# Patient Record
Sex: Female | Born: 1970 | Race: White | Hispanic: No | Marital: Married | Smoking: Former smoker
Health system: Southern US, Community
[De-identification: ages and names within clinical notes are randomized; demographics above are authoritative.]

## PROBLEM LIST (undated history)

## (undated) DIAGNOSIS — Z8249 Family history of ischemic heart disease and other diseases of the circulatory system: Secondary | ICD-10-CM

## (undated) DIAGNOSIS — E7800 Pure hypercholesterolemia, unspecified: Secondary | ICD-10-CM

## (undated) DIAGNOSIS — I10 Essential (primary) hypertension: Secondary | ICD-10-CM

## (undated) HISTORY — DX: Essential (primary) hypertension: I10

## (undated) HISTORY — DX: Family history of ischemic heart disease and other diseases of the circulatory system: Z82.49

## (undated) HISTORY — PX: BREAST ENHANCEMENT SURGERY: SHX7

## (undated) HISTORY — DX: Pure hypercholesterolemia, unspecified: E78.00

## (undated) HISTORY — DX: Hypercalcemia: E83.52

---

## 2018-01-22 LAB — HM MAMMOGRAPHY

## 2018-02-11 ENCOUNTER — Encounter: Payer: Self-pay | Admitting: Family Medicine

## 2018-02-11 ENCOUNTER — Ambulatory Visit: Payer: BLUE CROSS/BLUE SHIELD | Admitting: Family Medicine

## 2018-02-11 VITALS — BP 150/100 | HR 56 | Temp 98.4°F | Resp 16 | Ht 70.25 in | Wt 200.0 lb

## 2018-02-11 DIAGNOSIS — Z7689 Persons encountering health services in other specified circumstances: Secondary | ICD-10-CM

## 2018-02-11 DIAGNOSIS — E559 Vitamin D deficiency, unspecified: Secondary | ICD-10-CM

## 2018-02-11 DIAGNOSIS — I10 Essential (primary) hypertension: Secondary | ICD-10-CM | POA: Diagnosis not present

## 2018-02-11 DIAGNOSIS — R05 Cough: Secondary | ICD-10-CM | POA: Diagnosis not present

## 2018-02-11 DIAGNOSIS — R0781 Pleurodynia: Secondary | ICD-10-CM

## 2018-02-11 DIAGNOSIS — Z87891 Personal history of nicotine dependence: Secondary | ICD-10-CM | POA: Diagnosis not present

## 2018-02-11 DIAGNOSIS — E7800 Pure hypercholesterolemia, unspecified: Secondary | ICD-10-CM

## 2018-02-11 DIAGNOSIS — R058 Other specified cough: Secondary | ICD-10-CM

## 2018-02-11 DIAGNOSIS — Z8249 Family history of ischemic heart disease and other diseases of the circulatory system: Secondary | ICD-10-CM

## 2018-02-11 DIAGNOSIS — Z8349 Family history of other endocrine, nutritional and metabolic diseases: Secondary | ICD-10-CM

## 2018-02-11 DIAGNOSIS — E2839 Other primary ovarian failure: Secondary | ICD-10-CM

## 2018-02-11 LAB — POCT URINALYSIS DIP (PROADVANTAGE DEVICE)
Bilirubin, UA: NEGATIVE
Glucose, UA: NEGATIVE mg/dL
Ketones, POC UA: NEGATIVE mg/dL
LEUKOCYTES UA: NEGATIVE
Nitrite, UA: NEGATIVE
PH UA: 7 (ref 5.0–8.0)
Protein Ur, POC: NEGATIVE mg/dL
Specific Gravity, Urine: 1.015
Urobilinogen, Ur: NEGATIVE

## 2018-02-11 MED ORDER — LISINOPRIL 20 MG PO TABS: 20.0000 mg | ORAL_TABLET | Freq: Every day | ORAL | 2 refills | Status: DC

## 2018-02-11 MED ORDER — ATORVASTATIN CALCIUM 20 MG PO TABS: 20.0000 mg | ORAL_TABLET | Freq: Every day | ORAL | 2 refills | Status: DC

## 2018-02-11 NOTE — Progress Notes (Signed)
Subjective:    Patient ID: Paula Bryant, female    DOB: 04-Aug-1970, 48 y.o.   MRN: [id number]  HPI Chief Complaint  Patient presents with  . new pt    new pt, right side in back under rib cage. going on for 8 months. hurts when sleeping or taking deep breathe.    She is new to the practice and here for an acute complaint of right sided rib pain for the past 8 months. No known injury.  States pain feels like a soreness and sharp occasionally. States pain is worse when laying on her right side and with deep breaths. Pain has been more constant for the past month.  Denies fever, chills, dizziness, vision changes, headaches, chest pain, palpitations, shortness of breath, abdominal pain, N/V/D, urinary symptoms.   She also reports a chronic cough for the past several months and states it seemed to  improve after she stopped smoking in October. Smoked 1/2 ppd for 20 years.  She is occasionally coughing up brown mucus.    States she has a history of elevated blood pressures and states she has been told in the past that she should start on medication. States she does not want to take medication and does not like to go to doctors.  She has a BP cuff at home but does not check her BP.  Diet is fairly healthy.  Reports family history of HTN and MI in mother and maternal grandparents. Denies ever seeing a cardiologist.   Gluten intolerance. 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   Previous medical care: States she also lives in [place] but works here.  Married and has 2 kids.   Other providers: [facility]  [facility]ermatologist - [facility]   LMP: March 2018  Endometrial ablation  She reports going through menopause and having hot flashes and night sweats   Last pap: 2018   History of vitamin D deficiency and taking a once weekly supplement. Has not had this rechecked since March 2019.   She has lab results with her from March 2019. Her LDL at that time was  177. HDL 68.   Reviewed allergies, medications, past medical, surgical, family, and social history.    Review of Systems Pertinent positives and negatives in the history of present illness.     Objective:   Physical Exam BP (!) 150/100   Pulse (!) 56   Temp 98.4 F (36.9 C) (Oral)   Resp 16   Ht 5' 10.25" (1.784 m)   Wt 200 lb (90.7 kg) Comment: per pt (did not want to weigh)  LMP 04/06/2016   SpO2 98%   BMI 28.49 kg/m   Alert and oriented and in no acute distress.  Pharyngeal area is normal. Neck is supple without adenopathy or thyromegaly. Cardiac exam shows a regular sinus rhythm without murmurs or gallops. Lungs are clear to auscultation. Extremities without edema, pulses intact. Skin is warm and dry. PERRLA, EOMs intact. CNs intact. DTRs symmetric and normal. No clonus.   ECG sinus brady with rate 53. Suspicious for LVH. No acute ST changes.      Assessment & Plan:  Rib pain on right side - Plan: DG Chest 2 View, DG Thoracic Spine W/Swimmers  Cough present for greater than 3 weeks - Plan: DG Chest 2 View, DG Thoracic Spine W/Swimmers  Former smoker - Plan: DG Thoracic Spine W/Swimmers, Ambulatory referral to Cardiology  Vitamin D deficiency - Plan: VITAMIN D 25 Hydroxy (Vit-D  Deficiency, Fractures)  Family history of heart disease in female family member before age 82 - Plan: EKG 12-Lead, Ambulatory referral to Cardiology  Uncontrolled hypertension - Plan: CBC with Differential/Platelet, Comprehensive metabolic panel, POCT Urinalysis DIP (Proadvantage Device), Lipid panel, EKG 12-Lead, Ambulatory referral to Cardiology, lisinopril (PRINIVIL,ZESTRIL) 20 MG tablet  Family history of thyroid disease in mother - Plan: TSH, T4, free  Elevated LDL cholesterol level - Plan: Lipid panel, Ambulatory referral to Cardiology, atorvastatin (LIPITOR) 20 MG tablet  Encounter to establish care  Estrogen deficiency - Plan: DG Bone Density  Essential hypertension  She is a  pleasant 48 year old female who is new to the practice today and here for an acute complaint of right posterior rib pain for the past 8 months.   While here she was found to have an elevated blood pressure and after further discussion she has had uncontrolled hypertension for approximately 2 years.  She has declined medication in the past and avoided medical care.  X-ray ordered to evaluate area of concern for rib pain and for persistent cough.   Discussed multiple risk factors for heart disease including uncontrolled hypertension, history of elevated LDL, former smoker, and a significant family history. Referral to cardiology for further evaluation. She agrees to start on blood pressure medication but only agrees to starting on 1 medication at a low dose. I also convinced her to start on a statin. Counseling done on healthy lifestyle including diet and exercise to help control hypertension and hyperlipidemia. Encouraged her to hold off on any intense exercise until cleared by cardiology. She brought in lab results from March 2019.  States she had these done at a walk-in lab and that no medical professional has reviewed them.  Her LDL is 177.  History of 4 metatarsal fractures per patient without any known cause.  She is menopausal and estrogen deficient.  Denies ever having a bone density study.  Bone density test ordered. History of vitamin D deficiency.  She is taking a vitamin D supplement.  We will recheck vitamin D level. Check labs and have her follow-up in 2 weeks. Spent at least 45 minutes face-to-face with patient and more than 50% was in counseling and coordination of care.

## 2018-02-11 NOTE — Patient Instructions (Addendum)
You can go to [facility] for your chest and rib X rays.  [facility] and [facility]   Your blood pressure today is 150/100. Goal BP is less than 130/80.   I am referring you to a cardiologist due to multiple risk factors for heart disease and I recommend that you start on medication for your BP as well as for your cholesterol.   Buy a BP cuff and start checking your BP on a regular basis.     DASH Eating Plan DASH stands for "Dietary Approaches to Stop Hypertension." The DASH eating plan is a healthy eating plan that has been shown to reduce high blood pressure (hypertension). It may also reduce your risk for type 2 diabetes, heart disease, and stroke. The DASH eating plan may also help with weight loss. What are tips for following this plan?  General guidelines  Avoid eating more than 2,300 mg (milligrams) of salt (sodium) a day. If you have hypertension, you may need to reduce your sodium intake to 1,500 mg a day.  Limit alcohol intake to no more than 1 drink a day for nonpregnant women and 2 drinks a day for men. One drink equals 12 oz of beer, 5 oz of wine, or 1 oz of hard liquor.  Work with your health care provider to maintain a healthy body weight or to lose weight. Ask what an ideal weight is for you.  Get at least 30 minutes of exercise that causes your heart to beat faster (aerobic exercise) most days of the week. Activities may include walking, swimming, or biking.  Work with your health care provider or diet and nutrition specialist (dietitian) to adjust your eating plan to your individual calorie needs. Reading food labels   Check food labels for the amount of sodium per serving. Choose foods with less than 5 percent of the Daily Value of sodium. Generally, foods with less than 300 mg of sodium per serving fit into this eating plan.  To find whole grains, look for the word "whole" as the first word in the ingredient  list. Shopping  Buy products labeled as "low-sodium" or "no salt added."  Buy fresh foods. Avoid canned foods and premade or frozen meals. Cooking  Avoid adding salt when cooking. Use salt-free seasonings or herbs instead of table salt or sea salt. Check with your health care provider or pharmacist before using salt substitutes.  Do not fry foods. Cook foods using healthy methods such as baking, boiling, grilling, and broiling instead.  Cook with heart-healthy oils, such as olive, canola, soybean, or sunflower oil. Meal planning  Eat a balanced diet that includes: ? 5 or more servings of fruits and vegetables each day. At each meal, try to fill half of your plate with fruits and vegetables. ? Up to 6-8 servings of whole grains each day. ? Less than 6 oz of lean meat, poultry, or fish each day. A 3-oz serving of meat is about the same size as a deck of cards. One egg equals 1 oz. ? 2 servings of low-fat dairy each day. ? A serving of nuts, seeds, or beans 5 times each week. ? Heart-healthy fats. Healthy fats called Omega-3 fatty acids are found in foods such as flaxseeds and coldwater fish, like sardines, salmon, and mackerel.  Limit how much you eat of the following: ? Canned or prepackaged foods. ? Food that is high in trans fat, such as fried foods. ? Food that  is high in saturated fat, such as fatty meat. ? Sweets, desserts, sugary drinks, and other foods with added sugar. ? Full-fat dairy products.  Do not salt foods before eating.  Try to eat at least 2 vegetarian meals each week.  Eat more home-cooked food and less restaurant, buffet, and fast food.  When eating at a restaurant, ask that your food be prepared with less salt or no salt, if possible. What foods are recommended? The items listed may not be a complete list. Talk with your dietitian about what dietary choices are best for you. Grains Whole-grain or whole-wheat bread. Whole-grain or whole-wheat pasta. Brown  rice. Oatmeal. Quinoa. Bulgur. Whole-grain and low-sodium cereals. Pita bread. Low-fat, low-sodium crackers. Whole-wheat flour tortillas. Vegetables Fresh or frozen vegetables (raw, steamed, roasted, or grilled). Low-sodium or reduced-sodium tomato and vegetable juice. Low-sodium or reduced-sodium tomato sauce and tomato paste. Low-sodium or reduced-sodium canned vegetables. Fruits All fresh, dried, or frozen fruit. Canned fruit in natural juice (without added sugar). Meat and other protein foods Skinless chicken or turkey. Ground chicken or turkey. Pork with fat trimmed off. Fish and seafood. Egg whites. Dried beans, peas, or lentils. Unsalted nuts, nut butters, and seeds. Unsalted canned beans. Lean cuts of beef with fat trimmed off. Low-sodium, lean deli meat. Dairy Low-fat (1%) or fat-free (skim) milk. Fat-free, low-fat, or reduced-fat cheeses. Nonfat, low-sodium ricotta or cottage cheese. Low-fat or nonfat yogurt. Low-fat, low-sodium cheese. Fats and oils Soft margarine without trans fats. Vegetable oil. Low-fat, reduced-fat, or light mayonnaise and salad dressings (reduced-sodium). Canola, safflower, olive, soybean, and sunflower oils. Avocado. Seasoning and other foods Herbs. Spices. Seasoning mixes without salt. Unsalted popcorn and pretzels. Fat-free sweets. What foods are not recommended? The items listed may not be a complete list. Talk with your dietitian about what dietary choices are best for you. Grains Baked goods made with fat, such as croissants, muffins, or some breads. Dry pasta or rice meal packs. Vegetables Creamed or fried vegetables. Vegetables in a cheese sauce. Regular canned vegetables (not low-sodium or reduced-sodium). Regular canned tomato sauce and paste (not low-sodium or reduced-sodium). Regular tomato and vegetable juice (not low-sodium or reduced-sodium). Pickles. Olives. Fruits Canned fruit in a light or heavy syrup. Fried fruit. Fruit in cream or butter  sauce. Meat and other protein foods Fatty cuts of meat. Ribs. Fried meat. Bacon. Sausage. Bologna and other processed lunch meats. Salami. Fatback. Hotdogs. Bratwurst. Salted nuts and seeds. Canned beans with added salt. Canned or smoked fish. Whole eggs or egg yolks. Chicken or turkey with skin. Dairy Whole or 2% milk, cream, and half-and-half. Whole or full-fat cream cheese. Whole-fat or sweetened yogurt. Full-fat cheese. Nondairy creamers. Whipped toppings. Processed cheese and cheese spreads. Fats and oils Butter. Stick margarine. Lard. Shortening. Ghee. Bacon fat. Tropical oils, such as coconut, palm kernel, or palm oil. Seasoning and other foods Salted popcorn and pretzels. Onion salt, garlic salt, seasoned salt, table salt, and sea salt. Worcestershire sauce. Tartar sauce. Barbecue sauce. Teriyaki sauce. Soy sauce, including reduced-sodium. Steak sauce. Canned and packaged gravies. Fish sauce. Oyster sauce. Cocktail sauce. Horseradish that you find on the shelf. Ketchup. Mustard. Meat flavorings and tenderizers. Bouillon cubes. Hot sauce and Tabasco sauce. Premade or packaged marinades. Premade or packaged taco seasonings. Relishes. Regular salad dressings. Where to find more information:  National Heart, Lung, and Blood Institute: www.nhlbi.nih.gov  American Heart Association: www.heart.org Summary  The DASH eating plan is a healthy eating plan that has been shown to reduce high blood pressure (hypertension).   It may also reduce your risk for type 2 diabetes, heart disease, and stroke.  With the DASH eating plan, you should limit salt (sodium) intake to 2,300 mg a day. If you have hypertension, you may need to reduce your sodium intake to 1,500 mg a day.  When on the DASH eating plan, aim to eat more fresh fruits and vegetables, whole grains, lean proteins, low-fat dairy, and heart-healthy fats.  Work with your health care provider or diet and nutrition specialist (dietitian) to adjust  your eating plan to your individual calorie needs. This information is not intended to replace advice given to you by your health care provider. Make sure you discuss any questions you have with your health care provider. Document Released: 01/12/2011 Document Revised: 01/17/2016 Document Reviewed: 01/17/2016 Elsevier Interactive Patient Education  2019 [employer]   Hypertension Hypertension, commonly called high blood pressure, is when the force of blood pumping through the arteries is too strong. The arteries are the blood vessels that carry blood from the heart throughout the body. Hypertension forces the heart to work harder to pump blood and may cause arteries to become narrow or stiff. Having untreated or uncontrolled hypertension can cause heart attacks, strokes, kidney disease, and other problems. A blood pressure reading consists of a higher number over a lower number. Ideally, your blood pressure should be below 120/80. The first ("top") number is called the systolic pressure. It is a measure of the pressure in your arteries as your heart beats. The second ("bottom") number is called the diastolic pressure. It is a measure of the pressure in your arteries as the heart relaxes. What are the causes? The cause of this condition is not known. What increases the risk? Some risk factors for high blood pressure are under your control. Others are not. Factors you can change  Smoking.  Having type 2 diabetes mellitus, high cholesterol, or both.  Not getting enough exercise or physical activity.  Being overweight.  Having too much fat, sugar, calories, or salt (sodium) in your diet.  Drinking too much alcohol. Factors that are difficult or impossible to change  Having chronic kidney disease.  Having a family history of high blood pressure.  Age. Risk increases with age.  Race. You may be at higher risk if you are African-American.  Gender. Men are at higher risk than women  before age 48. After age 48, women are at higher risk than men.  Having obstructive sleep apnea.  Stress. What are the signs or symptoms? Extremely high blood pressure (hypertensive crisis) may cause:  Headache.  Anxiety.  Shortness of breath.  Nosebleed.  Nausea and vomiting.  Severe chest pain.  Jerky movements you cannot control (seizures). How is this diagnosed? This condition is diagnosed by measuring your blood pressure while you are seated, with your arm resting on a surface. The cuff of the blood pressure monitor will be placed directly against the skin of your upper arm at the level of your heart. It should be measured at least twice using the same arm. Certain conditions can cause a difference in blood pressure between your right and left arms. Certain factors can cause blood pressure readings to be lower or higher than normal (elevated) for a short period of time:  When your blood pressure is higher when you are in a health care provider's office than when you are at home, this is called white coat hypertension. Most people with this condition do not need medicines.  When your blood  pressure is higher at home than when you are in a health care provider's office, this is called masked hypertension. Most people with this condition may need medicines to control blood pressure. If you have a high blood pressure reading during one visit or you have normal blood pressure with other risk factors:  You may be asked to return on a different day to have your blood pressure checked again.  You may be asked to monitor your blood pressure at home for 1 week or longer. If you are diagnosed with hypertension, you may have other blood or imaging tests to help your health care provider understand your overall risk for other conditions. How is this treated? This condition is treated by making healthy lifestyle changes, such as eating healthy foods, exercising more, and reducing your  alcohol intake. Your health care provider may prescribe medicine if lifestyle changes are not enough to get your blood pressure under control, and if:  Your systolic blood pressure is above 130.  Your diastolic blood pressure is above 80. Your personal target blood pressure may vary depending on your medical conditions, your age, and other factors. Follow these instructions at home: Eating and drinking   Eat a diet that is high in fiber and potassium, and low in sodium, added sugar, and fat. An example eating plan is called the DASH (Dietary Approaches to Stop Hypertension) diet. To eat this way: ? Eat plenty of fresh fruits and vegetables. Try to fill half of your plate at each meal with fruits and vegetables. ? Eat whole grains, such as whole wheat pasta, brown rice, or whole grain bread. Fill about one quarter of your plate with whole grains. ? Eat or drink low-fat dairy products, such as skim milk or low-fat yogurt. ? Avoid fatty cuts of meat, processed or cured meats, and poultry with skin. Fill about one quarter of your plate with lean proteins, such as fish, chicken without skin, beans, eggs, and tofu. ? Avoid premade and processed foods. These tend to be higher in sodium, added sugar, and fat.  Reduce your daily sodium intake. Most people with hypertension should eat less than 1,500 mg of sodium a day.  Limit alcohol intake to no more than 1 drink a day for nonpregnant women and 2 drinks a day for men. One drink equals 12 oz of beer, 5 oz of wine, or 1 oz of hard liquor. Lifestyle   Work with your health care provider to maintain a healthy body weight or to lose weight. Ask what an ideal weight is for you.  Get at least 30 minutes of exercise that causes your heart to beat faster (aerobic exercise) most days of the week. Activities may include walking, swimming, or biking.  Include exercise to strengthen your muscles (resistance exercise), such as pilates or lifting weights, as  part of your weekly exercise routine. Try to do these types of exercises for 30 minutes at least 3 days a week.  Do not use any products that contain nicotine or tobacco, such as cigarettes and e-cigarettes. If you need help quitting, ask your health care provider.  Monitor your blood pressure at home as told by your health care provider.  Keep all follow-up visits as told by your health care provider. This is important. Medicines  Take over-the-counter and prescription medicines only as told by your health care provider. Follow directions carefully. Blood pressure medicines must be taken as prescribed.  Do not skip doses of blood pressure medicine. Doing this  puts you at risk for problems and can make the medicine less effective.  Ask your health care provider about side effects or reactions to medicines that you should watch for. Contact a health care provider if:  You think you are having a reaction to a medicine you are taking.  You have headaches that keep coming back (recurring).  You feel dizzy.  You have swelling in your ankles.  You have trouble with your vision. Get help right away if:  You develop a severe headache or confusion.  You have unusual weakness or numbness.  You feel faint.  You have severe pain in your chest or abdomen.  You vomit repeatedly.  You have trouble breathing. Summary  Hypertension is when the force of blood pumping through your arteries is too strong. If this condition is not controlled, it may put you at risk for serious complications.  Your personal target blood pressure may vary depending on your medical conditions, your age, and other factors. For most people, a normal blood pressure is less than 120/80.  Hypertension is treated with lifestyle changes, medicines, or a combination of both. Lifestyle changes include weight loss, eating a healthy, low-sodium diet, exercising more, and limiting alcohol. This information is not intended  to replace advice given to you by your health care provider. Make sure you discuss any questions you have with your health care provider. Document Released: 01/23/2005 Document Revised: 12/22/2015 Document Reviewed: 12/22/2015 Elsevier Interactive Patient Education  2019 [employer].

## 2018-02-12 ENCOUNTER — Ambulatory Visit
Admission: RE | Admit: 2018-02-12 | Discharge: 2018-02-12 | Disposition: A | Discharge status: Home / Self Care | Payer: BLUE CROSS/BLUE SHIELD | Source: Ambulatory Visit | Attending: Family Medicine | Admitting: Family Medicine

## 2018-02-12 DIAGNOSIS — E2839 Other primary ovarian failure: Secondary | ICD-10-CM

## 2018-02-12 DIAGNOSIS — Z87891 Personal history of nicotine dependence: Secondary | ICD-10-CM

## 2018-02-12 DIAGNOSIS — R05 Cough: Secondary | ICD-10-CM

## 2018-02-12 DIAGNOSIS — R0781 Pleurodynia: Secondary | ICD-10-CM

## 2018-02-12 DIAGNOSIS — R058 Other specified cough: Secondary | ICD-10-CM

## 2018-02-12 LAB — VITAMIN D 25 HYDROXY (VIT D DEFICIENCY, FRACTURES): Vit D, 25-Hydroxy: 39.5 ng/mL (ref 30.0–100.0)

## 2018-02-12 LAB — COMPREHENSIVE METABOLIC PANEL
ALT: 21 IU/L (ref 0–32)
AST: 32 IU/L (ref 0–40)
Albumin/Globulin Ratio: 1.7 (ref 1.2–2.2)
Albumin: 4.8 g/dL (ref 3.5–5.5)
Alkaline Phosphatase: 82 IU/L (ref 39–117)
BUN/Creatinine Ratio: 20 (ref 9–23)
BUN: 15 mg/dL (ref 6–24)
Bilirubin Total: 0.7 mg/dL (ref 0.0–1.2)
CO2: 24 mmol/L (ref 20–29)
Calcium: 11.2 mg/dL — ABNORMAL HIGH (ref 8.7–10.2)
Chloride: 94 mmol/L — ABNORMAL LOW (ref 96–106)
Creatinine, Ser: 0.75 mg/dL (ref 0.57–1.00)
GFR calc Af Amer: 110 mL/min/{1.73_m2} (ref >59)
GFR calc non Af Amer: 95 mL/min/{1.73_m2} (ref >59)
Globulin, Total: 2.9 g/dL (ref 1.5–4.5)
Glucose: 92 mg/dL (ref 65–99)
Potassium: 3.9 mmol/L (ref 3.5–5.2)
Sodium: 134 mmol/L (ref 134–144)
Total Protein: 7.7 g/dL (ref 6.0–8.5)

## 2018-02-12 LAB — LIPID PANEL
Chol/HDL Ratio: 3.2 ratio (ref 0.0–4.4)
Cholesterol, Total: 325 mg/dL — ABNORMAL HIGH (ref 100–199)
HDL: 103 mg/dL (ref >39)
LDL Calculated: 203 mg/dL — ABNORMAL HIGH (ref 0–99)
Triglycerides: 95 mg/dL (ref 0–149)
VLDL Cholesterol Cal: 19 mg/dL (ref 5–40)

## 2018-02-12 LAB — CBC WITH DIFFERENTIAL/PLATELET
BASOS ABS: 0.1 10*3/uL (ref 0.0–0.2)
Basos: 1 %
EOS (ABSOLUTE): 0.1 10*3/uL (ref 0.0–0.4)
Eos: 2 %
Hematocrit: 42.2 % (ref 34.0–46.6)
Hemoglobin: 14.6 g/dL (ref 11.1–15.9)
Immature Grans (Abs): 0 10*3/uL (ref 0.0–0.1)
Immature Granulocytes: 0 %
Lymphocytes Absolute: 1.5 10*3/uL (ref 0.7–3.1)
Lymphs: 29 %
MCH: 32.7 pg (ref 26.6–33.0)
MCHC: 34.6 g/dL (ref 31.5–35.7)
MCV: 95 fL (ref 79–97)
Monocytes Absolute: 0.6 10*3/uL (ref 0.1–0.9)
Monocytes: 12 %
Neutrophils Absolute: 3 10*3/uL (ref 1.4–7.0)
Neutrophils: 56 %
Platelets: 288 10*3/uL (ref 150–450)
RBC: 4.46 x10E6/uL (ref 3.77–5.28)
RDW: 13 % (ref 11.7–15.4)
WBC: 5.3 10*3/uL (ref 3.4–10.8)

## 2018-02-12 LAB — T4, FREE: Free T4: 1.19 ng/dL (ref 0.82–1.77)

## 2018-02-12 LAB — TSH: TSH: 4.09 u[IU]/mL (ref 0.450–4.500)

## 2018-02-13 ENCOUNTER — Ambulatory Visit
Admission: RE | Admit: 2018-02-13 | Discharge: 2018-02-13 | Disposition: A | Discharge status: Home / Self Care | Payer: BLUE CROSS/BLUE SHIELD | Source: Ambulatory Visit | Attending: Family Medicine | Admitting: Family Medicine

## 2018-02-13 DIAGNOSIS — R0781 Pleurodynia: Secondary | ICD-10-CM

## 2018-02-13 DIAGNOSIS — R058 Other specified cough: Secondary | ICD-10-CM

## 2018-02-13 DIAGNOSIS — R05 Cough: Secondary | ICD-10-CM

## 2018-02-14 ENCOUNTER — Encounter: Payer: Self-pay | Admitting: Family Medicine

## 2018-02-14 HISTORY — DX: Hypercalcemia: E83.52

## 2018-02-15 ENCOUNTER — Institutional Professional Consult (permissible substitution): Payer: BLUE CROSS/BLUE SHIELD | Admitting: Family Medicine

## 2018-02-25 ENCOUNTER — Ambulatory Visit (INDEPENDENT_AMBULATORY_CARE_PROVIDER_SITE_OTHER): Payer: BLUE CROSS/BLUE SHIELD | Admitting: Family Medicine

## 2018-02-25 ENCOUNTER — Encounter: Payer: Self-pay | Admitting: Family Medicine

## 2018-02-25 VITALS — BP 132/82 | HR 53 | Wt 199.0 lb

## 2018-02-25 DIAGNOSIS — I10 Essential (primary) hypertension: Secondary | ICD-10-CM

## 2018-02-25 DIAGNOSIS — M8580 Other specified disorders of bone density and structure, unspecified site: Secondary | ICD-10-CM | POA: Diagnosis not present

## 2018-02-25 DIAGNOSIS — E7800 Pure hypercholesterolemia, unspecified: Secondary | ICD-10-CM

## 2018-02-25 NOTE — Progress Notes (Signed)
   Subjective:    Patient ID: Paula Bryant, female    DOB: 07/27/1970, 48 y.o.   MRN: [id number]  HPI Chief Complaint  Patient presents with  . follow-up    follow-up on bp and pain in back- doing alittle better. bp- lowest- 112/73 and highest- 161/94   She is here to follow up on HTN, abnormal labs, and abnormal DEXA.  Reports taking lisinopril daily without side effects.   States she has not yet heard from the cardiologist regarding get established appointment.  Reports having intermittent palpitations a couple of times a week.  Reports she notices this more often when she is laying down and not with exertion.  No chest pain or shortness of breath.    Her LDL was significantly elevated at 203 and we started her on a statin.  She reports taking this daily and no side effects.  Reports eating a healthy diet but is low in cholesterol and fat.  She does have a family history of hyperlipidemia.  Requests a repeat fasting lipid panel today.  Denies fever, chills, dizziness, chest pain,  shortness of breath, cough, abdominal pain, N/V/D, LE edema.   Her calcium was elevated at her previous visit.  She reports she was taking a significant amount of calcium including a women's One a Day, Viactiv, protein shake, and drinking almond milk.   DEXA showed osteopenia.  Her vitamin D level was normal.   [facility] -reports to be up-to-date on mammogram and Pap smear.  I have no record of these and I will request them  Reviewed allergies, medications, past medical, surgical, family, and social history.    Review of Systems Pertinent positives and negatives in the history of present illness.     Objective:   Physical Exam BP 132/82   Pulse (!) 53   Wt 199 lb (90.3 kg) Comment: per pt-  BMI 28.35 kg/m   Alert and oriented and in no acute distress.  Not otherwise examined.      Assessment & Plan:  Essential hypertension  Hypercalcemia - Plan: Comprehensive metabolic panel,  PTH, Intact and Calcium  Osteopenia determined by x-ray  Elevated LDL cholesterol level - Plan: Lipid panel  This is her second visit with me.  I reviewed in person her lab results, x-ray results and DEXA. She appears to be doing well on the lisinopril and her blood pressure is responding nicely.  We will continue on this for now.  Diet seems to be somewhat healthy. Reports good compliance with statin and no side effects. Her calcium was significantly elevated at her previous visit.  We will recheck this today and also check PTH. Counseling on osteopenia. She requests a repeat fasting lipid panel today.  Advised her that insurance may not cover this since her last one was 02/11/2018 and that we will most likely not see a significant improvement at this point.  she would still like to have it checked. Follow-up pending labs.  She will see cardiology on 03/15/2018.  Encouraged her to avoid starting a vigorous exercise routine before being evaluated by them.

## 2018-02-26 LAB — COMPREHENSIVE METABOLIC PANEL
ALT: 22 IU/L (ref 0–32)
AST: 27 IU/L (ref 0–40)
Albumin/Globulin Ratio: 2 (ref 1.2–2.2)
Albumin: 4.7 g/dL (ref 3.8–4.8)
Alkaline Phosphatase: 76 IU/L (ref 39–117)
BUN/Creatinine Ratio: 15 (ref 9–23)
BUN: 11 mg/dL (ref 6–24)
Bilirubin Total: 0.8 mg/dL (ref 0.0–1.2)
CO2: 23 mmol/L (ref 20–29)
Calcium: 9.8 mg/dL (ref 8.7–10.2)
Chloride: 96 mmol/L (ref 96–106)
Creatinine, Ser: 0.71 mg/dL (ref 0.57–1.00)
GFR calc Af Amer: 117 mL/min/{1.73_m2} (ref >59)
GFR calc non Af Amer: 102 mL/min/{1.73_m2} (ref >59)
Globulin, Total: 2.4 g/dL (ref 1.5–4.5)
Glucose: 76 mg/dL (ref 65–99)
Potassium: 4.5 mmol/L (ref 3.5–5.2)
Sodium: 137 mmol/L (ref 134–144)
Total Protein: 7.1 g/dL (ref 6.0–8.5)

## 2018-02-26 LAB — LIPID PANEL
Chol/HDL Ratio: 3 ratio (ref 0.0–4.4)
Cholesterol, Total: 207 mg/dL — ABNORMAL HIGH (ref 100–199)
HDL: 70 mg/dL (ref >39)
LDL Calculated: 122 mg/dL — ABNORMAL HIGH (ref 0–99)
Triglycerides: 73 mg/dL (ref 0–149)
VLDL Cholesterol Cal: 15 mg/dL (ref 5–40)

## 2018-02-26 LAB — PTH, INTACT AND CALCIUM: PTH: 25 pg/mL (ref 15–65)

## 2018-03-06 ENCOUNTER — Telehealth: Payer: Self-pay | Admitting: Family Medicine

## 2018-03-06 NOTE — Telephone Encounter (Signed)
Records received from [facility]. Sending back for review.

## 2018-03-22 ENCOUNTER — Encounter: Payer: Self-pay | Admitting: Internal Medicine

## 2018-03-24 ENCOUNTER — Encounter: Payer: Self-pay | Admitting: Cardiology

## 2018-03-24 DIAGNOSIS — Z8249 Family history of ischemic heart disease and other diseases of the circulatory system: Secondary | ICD-10-CM

## 2018-03-24 HISTORY — DX: Family history of ischemic heart disease and other diseases of the circulatory system: Z82.49

## 2018-03-24 NOTE — Progress Notes (Signed)
Cardiology Office Note    Date:  03/25/2018   ID:  Jameice Sarsour, DOB Dec 29, 1970, MRN [id number]  PCP:  Avanell Shackleton, NP-C  Cardiologist:  Armanda Magic, MD   Chief Complaint  Patient presents with  . Hyperlipidemia    History of Present Illness:  Paula Bryant is a 48 y.o. female who is being seen today for the evaluation of Hyperlipidemia  at the request of Suezanne Jacquet, Vickie L, NP-C.  This is a 48yo female with a hx of hyperlipidemia, HTN and hypercalcemia who is referred by PCP for evaluation of hyperlipidemia.  She has a hx of tobacco use and has a strong fm hx of premature CAD with both parents having MIs in their 45's. She tells me that she has been having chest aching like a tooth ache recently with no associated SOB, nausea, diaphoresis.  This occurs a few times weekly and is nonexertional.  She denies any SOB, DOE, PND, orthopnea, LE edema, dizziness or syncope. She is compliant with her meds and is tolerating meds with no SE.    Past Medical History:  Diagnosis Date  . Elevated LDL cholesterol level   . Family history of premature coronary artery disease 03/24/2018  . HTN (hypertension)   . Hypercalcemia 02/14/2018    Past Surgical History:  Procedure Laterality Date  . BREAST ENHANCEMENT SURGERY      Current Medications: Current Meds  Medication Sig  . APPLE CIDER VINEGAR PO Take by mouth.  . Ascorbic Acid (VITAMIN C PO) Take by mouth.  Marland Kitchen BIOTIN PO Take by mouth.  . COD LIVER OIL PO Take by mouth.  . Doxylamine Succinate, Sleep, (SLEEP AID PO) Take by mouth.  . Ginkgo Biloba (GNP GINGKO BILOBA EXTRACT PO) Take by mouth.  Marland Kitchen lisinopril (PRINIVIL,ZESTRIL) 20 MG tablet Take 1 tablet (20 mg total) by mouth daily.  . Multiple Vitamin (MULTIVITAMIN) tablet Take 1 tablet by mouth daily.  . NON FORMULARY Amberen  . Nutritional Supplements (ESTROVEN PO) Take by mouth.    Allergies:   Patient has no known allergies.   Social History   Socioeconomic History  .  Marital status: Married    Spouse name: Not on file  . Number of children: Not on file  . Years of education: Not on file  . Highest education level: Not on file  Occupational History  . Not on file  Social Needs  . Financial resource strain: Not on file  . Food insecurity:    Worry: Not on file    Inability: Not on file  . Transportation needs:    Medical: Not on file    Non-medical: Not on file  Tobacco Use  . Smoking status: Former Smoker    Packs/day: 0.50    Years: 20.00    Pack years: 10.00    Types: Cigarettes    Last attempt to quit: 11/08/2017    Years since quitting: 0.3  . Smokeless tobacco: Never Used  Substance and Sexual Activity  . Alcohol use: Yes    Comment: 4-5 glasses of wine  . Drug use: Never  . Sexual activity: Not on file  Lifestyle  . Physical activity:    Days per week: Not on file    Minutes per session: Not on file  . Stress: Not on file  Relationships  . Social connections:    Talks on phone: Not on file    Gets together: Not on file    Attends religious service: Not on file  Active member of club or organization: Not on file    Attends meetings of clubs or organizations: Not on file    Relationship status: Not on file  Other Topics Concern  . Not on file  Social History Narrative  . Not on file     Family History:  The patient's family history includes Crohn's disease in her brother; Heart attack in her father; Heart attack (age of onset: 34) in her mother; Heart attack (age of onset: 61) in her maternal grandmother; Heart attack (age of onset: 55) in her maternal grandfather; Hyperlipidemia in her mother; Hypertension in her father and mother; Thyroid disease in her mother.   ROS:   Please see the history of present illness.    ROS All other systems reviewed and are negative.  No flowsheet data found.     PHYSICAL EXAM:   VS:  BP (!) 144/89   Pulse 64   Ht 5' 10.25" (1.784 m)   Wt 198 lb (89.8 kg)   BMI 28.21 kg/m      GEN: Well nourished, well developed, in no acute distress  HEENT: normal  Neck: no JVD, carotid bruits, or masses Cardiac: RRR; no murmurs, rubs, or gallops,no edema.  Intact distal pulses bilaterally.  Respiratory:  clear to auscultation bilaterally, normal work of breathing GI: soft, nontender, nondistended, + BS MS: no deformity or atrophy  Skin: warm and dry, no rash Neuro:  Alert and Oriented x 3, Strength and sensation are intact Psych: euthymic mood, full affect  Wt Readings from Last 3 Encounters:  03/25/18 198 lb (89.8 kg)  02/25/18 199 lb (90.3 kg)  02/11/18 200 lb (90.7 kg)      Studies/Labs Reviewed:   EKG:  EKG is not ordered today.    Recent Labs: 02/11/2018: Hemoglobin 14.6; Platelets 288; TSH 4.090 02/25/2018: ALT 22; BUN 11; Creatinine, Ser 0.71; Potassium 4.5; Sodium 137   Lipid Panel    Component Value Date/Time   CHOL 207 (H) 02/25/2018 1145   TRIG 73 02/25/2018 1145   HDL 70 02/25/2018 1145   CHOLHDL 3.0 02/25/2018 1145   LDLCALC 122 (H) 02/25/2018 1145    Additional studies/ records that were reviewed today include:  Office notes from PCP, lipids    ASSESSMENT:    1. Elevated LDL cholesterol level   2. Uncontrolled hypertension   3. Family history of premature coronary artery disease      PLAN:  In order of problems listed above:  1.  Hyperlipidemia - Her last lipids in January showed LDL 122 , HDL 70, TAG 73.  Her LDL goal is < 100 and preferably < 70 given her CRFs..  She stopped the Lipitor due to it causing severe mood changes.  I am going to start her on Livalo 2mg  daily and repeat FLP and ALT in 6 weeks.  I asked her to call if she has any side effects.   2.   HTN - BP is borderline controlled on exam today. I will increase Lisinopril to 30mg  daily and repeat BMET in 6 weeks.   3.  CP/Family hx of premature CAD - both parents had MIs in their 65's.  Her ASCVD risk is 3.2% but given fam hx of premature CAD.  She is having CP that is  atypical but given CRFs I will get a stress echo to evaluate further.  I will also get a Coronary Ca score to assess risk further.   4.  Palpitations - She is complaining  of occasional flip flopping of her heart.  This has been going on for about 5 years but recently more frequent.  I will get a Zio patch for 2 weeks to assess for PAF.    Medication Adjustments/Labs and Tests Ordered: Current medicines are reviewed at length with the patient today.  Concerns regarding medicines are outlined above.  Medication changes, Labs and Tests ordered today are listed in the Patient Instructions below.  There are no Patient Instructions on file for this visit.   Signed, Armanda Magicraci Mahogani Holohan, MD  03/25/2018 10:55 AM    [facility] [address], [place], [place]  [address] Phone: [phone]; Fax: [phone]

## 2018-03-25 ENCOUNTER — Encounter: Payer: Self-pay | Admitting: Cardiology

## 2018-03-25 ENCOUNTER — Ambulatory Visit: Payer: BLUE CROSS/BLUE SHIELD | Admitting: Cardiology

## 2018-03-25 VITALS — BP 144/89 | HR 64 | Ht 70.25 in | Wt 198.0 lb

## 2018-03-25 DIAGNOSIS — R079 Chest pain, unspecified: Secondary | ICD-10-CM | POA: Diagnosis not present

## 2018-03-25 DIAGNOSIS — Z8249 Family history of ischemic heart disease and other diseases of the circulatory system: Secondary | ICD-10-CM

## 2018-03-25 DIAGNOSIS — R002 Palpitations: Secondary | ICD-10-CM

## 2018-03-25 DIAGNOSIS — E785 Hyperlipidemia, unspecified: Secondary | ICD-10-CM

## 2018-03-25 DIAGNOSIS — I10 Essential (primary) hypertension: Secondary | ICD-10-CM | POA: Diagnosis not present

## 2018-03-25 DIAGNOSIS — E7800 Pure hypercholesterolemia, unspecified: Secondary | ICD-10-CM | POA: Diagnosis not present

## 2018-03-25 MED ORDER — LISINOPRIL 10 MG PO TABS: ORAL_TABLET | ORAL | 3 refills | Status: DC

## 2018-03-25 MED ORDER — PITAVASTATIN CALCIUM 2 MG PO TABS: 2.0000 mg | ORAL_TABLET | Freq: Every day | ORAL | 3 refills | Status: AC

## 2018-03-25 NOTE — Patient Instructions (Signed)
Medication Instructions:  Start: Livalo 2 mg, daily, by mouth Increase: Lisinopril to 30 mg, daily, by mouth If you need a refill on your cardiac medications before your next appointment, please call your pharmacy.   Lab work: Future: 05/08/18 fasting labs: BMET, LFT and Lipid  If you have labs (blood work) drawn today and your tests are completely normal, you will receive your results only by: Marland Kitchen MyChart Message (if you have MyChart) OR . A paper copy in the mail If you have any lab test that is abnormal or we need to change your treatment, we will call you to review the results.  Testing/Procedures: Your physician has requested that you have a stress echocardiogram. For further information please visit [email or web address] Please follow instruction sheet as given.  Your physician has recommended that you wear an 2 Week Zio Monitor.  Schedule Cardiac Calcium Scoring  Follow-Up: At [facility], you and your health needs are our priority.  As part of our continuing mission to provide you with exceptional heart care, we have created designated Provider Care Teams.  These Care Teams include your primary Cardiologist (physician) and Advanced Practice Providers (APPs -  Physician Assistants and Nurse Practitioners) who all work together to provide you with the care you need, when you need it. You will need a follow up appointment in 1 years.  Please call our office 2 months in advance to schedule this appointment.  You may see Dr. Mayford Knife or one of the following Advanced Practice Providers on your designated Care Team:   [place], PA-C Ronie Spies, PA-C . Jacolyn Reedy, PA-C  Any Other Special Instructions Will Be Listed Below (If Applicable). Check blood pressure for 1 week, 1-2 hours after taking your blood pressure medication. Call the office with the results.

## 2018-03-27 ENCOUNTER — Telehealth: Payer: Self-pay

## 2018-03-27 NOTE — Telephone Encounter (Addendum)
Message received from covermymeds: Della Goo Key: [id number] - PA Case ID: [id number] - Rx #: [id number] Outcome Approved today Case Id:[id number];Status:Approved;Review Type:Prior Auth;Coverage Start Date:03/27/2018;Coverage End Date:03/27/2019  I have notified the pts pharmacy of this approval. They ran the pts Livalo RX and it came back at a cost to the pt of $219.96 for a 30 day supply and per the pharmacist the pt has not met her deductible for the year yet.

## 2018-03-27 NOTE — Telephone Encounter (Addendum)
I have done a Livalo PA through covermymeds. Key: [id number]

## 2018-03-28 NOTE — Telephone Encounter (Signed)
Change Livalo to Pravastatin 40mg  daily and repeat FLP and ALT in 6 weeks

## 2018-03-28 NOTE — Telephone Encounter (Signed)
Left a message to call back.

## 2018-03-28 NOTE — Telephone Encounter (Signed)
I will route this to Suzan Nailer RN to discuss further with Dr. Mayford Knife as options.

## 2018-03-28 NOTE — Telephone Encounter (Signed)
° ° °  Pt c/o medication issue:  1. Name of Medication: Livalo  2. How are you currently taking this medication (dosage and times per day)? n/a  3. Are you having a reaction (difficulty breathing--STAT)? n/a  4. What is your medication issue?  Too costly, requesting recommendation for an all natural medication

## 2018-04-02 NOTE — Telephone Encounter (Signed)
Spoke with the patient, she stated she did not want to take any medications for her cholesterol, she is going to take a natural supplement. She also took her blood pressures, sending to Dr. Mayford Knife for reference.   2/25: 126/74 2/24: 127/84 2/22: 655/37

## 2018-04-02 NOTE — Telephone Encounter (Signed)
Left a detailed message, per dpr, about Dr. Norris Cross recommendations.

## 2018-04-02 NOTE — Telephone Encounter (Signed)
Please explained to her that the natural substances can be toxic to the liver and would recommend a medication that we can follow.

## 2018-04-16 ENCOUNTER — Ambulatory Visit (INDEPENDENT_AMBULATORY_CARE_PROVIDER_SITE_OTHER)
Admission: RE | Admit: 2018-04-16 | Discharge: 2018-04-16 | Disposition: A | Discharge status: Home / Self Care | Payer: Self-pay | Source: Ambulatory Visit | Attending: Cardiology | Admitting: Cardiology

## 2018-04-16 DIAGNOSIS — E785 Hyperlipidemia, unspecified: Secondary | ICD-10-CM

## 2018-04-19 ENCOUNTER — Telehealth (HOSPITAL_COMMUNITY): Payer: Self-pay | Admitting: *Deleted

## 2018-04-19 ENCOUNTER — Encounter (HOSPITAL_COMMUNITY): Payer: BLUE CROSS/BLUE SHIELD

## 2018-04-19 ENCOUNTER — Other Ambulatory Visit (HOSPITAL_COMMUNITY): Payer: BLUE CROSS/BLUE SHIELD

## 2018-04-19 NOTE — Telephone Encounter (Signed)
Patient given detailed instructions per Stress Test Requisition Sheet for test on 04/22/18 at 2:00.Patient Notified to arrive 30 minutes early, and that it is imperative to arrive on time for appointment to keep from having the test rescheduled.  Patient verbalized understanding. Daneil Dolin

## 2018-04-22 ENCOUNTER — Ambulatory Visit (HOSPITAL_COMMUNITY): Payer: BLUE CROSS/BLUE SHIELD

## 2018-04-22 ENCOUNTER — Other Ambulatory Visit: Payer: Self-pay

## 2018-04-22 ENCOUNTER — Ambulatory Visit (HOSPITAL_COMMUNITY): Payer: BLUE CROSS/BLUE SHIELD | Attending: Cardiology

## 2018-04-22 DIAGNOSIS — R079 Chest pain, unspecified: Secondary | ICD-10-CM | POA: Insufficient documentation

## 2018-04-24 ENCOUNTER — Institutional Professional Consult (permissible substitution): Payer: BLUE CROSS/BLUE SHIELD | Admitting: Family Medicine

## 2018-05-06 ENCOUNTER — Other Ambulatory Visit: Payer: Self-pay | Admitting: Family Medicine

## 2018-05-06 DIAGNOSIS — I10 Essential (primary) hypertension: Secondary | ICD-10-CM

## 2018-05-08 ENCOUNTER — Other Ambulatory Visit: Payer: BLUE CROSS/BLUE SHIELD

## 2018-08-07 ENCOUNTER — Other Ambulatory Visit: Payer: Self-pay | Admitting: Family Medicine

## 2018-08-07 DIAGNOSIS — I10 Essential (primary) hypertension: Secondary | ICD-10-CM

## 2018-08-07 NOTE — Telephone Encounter (Signed)
Pt was advised she needs to get this med from cardiology since they increased it and she was suppose to follow-up for blood work with them.

## 2018-08-22 ENCOUNTER — Telehealth: Payer: Self-pay | Admitting: Cardiology

## 2018-08-22 MED ORDER — LISINOPRIL 20 MG PO TABS: 30.0000 mg | ORAL_TABLET | Freq: Every day | ORAL | 3 refills | Status: AC

## 2018-08-22 NOTE — Telephone Encounter (Signed)
Correcting the patient's lisinopril dose to one prescription.

## 2018-08-22 NOTE — Telephone Encounter (Signed)
New Message    Pt c/o medication issue:  1. Name of Medication: Lisinopril  2. How are you currently taking this medication (dosage and times per day)? 10mg  1 tablet to be taking with 20mg  of Lisinopril ordered by Dr. Raenette Rover.  3. Are you having a reaction (difficulty breathing--STAT)? NO   4. What is your medication issue? Patient has 10mg  of the medication ordered by Dr. Radford Pax and 20mg  was ordered by patient's PCP Dr. Raenette Rover.  Patient states Dr. Raenette Rover will no longer order her prescription for Lisinopril and advised patient to let Dr. Radford Pax know so she can order the full dosage patient needs.

## 2019-12-25 IMAGING — CR DG THORACIC SPINE 3V
3 series · 3 of 3 positions shown · non-contrast
Comparison: None.

CLINICAL DATA: Chronic right-sided posterior chest pain without
known injury.

EXAM:
THORACIC SPINE - 3 VIEWS

[t t-spine a.p.]
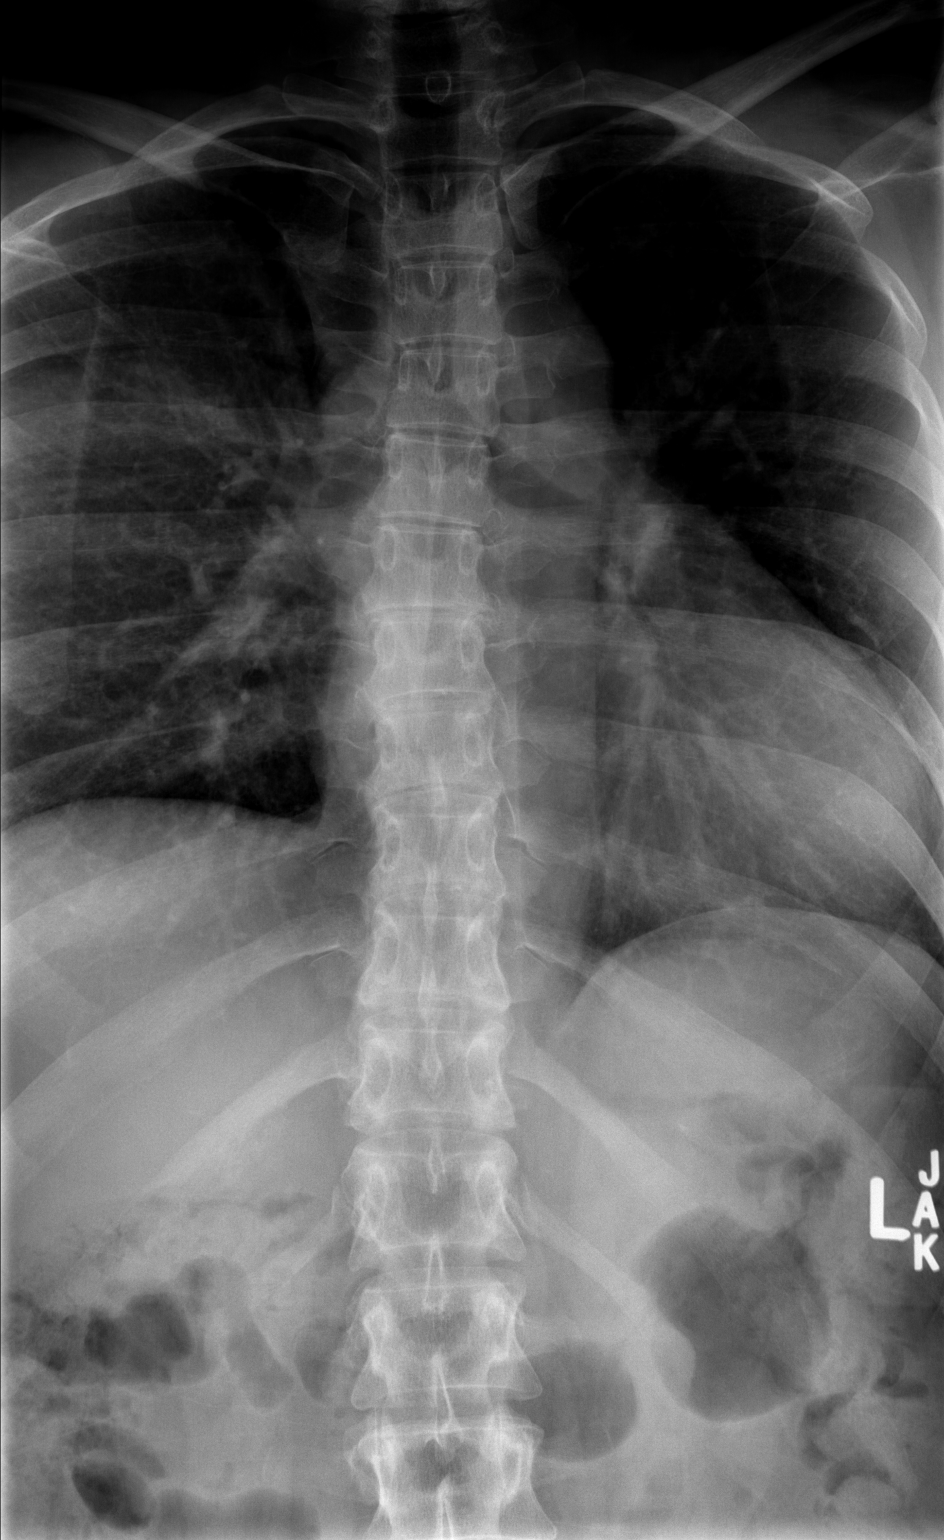

[t t-spine lat *]
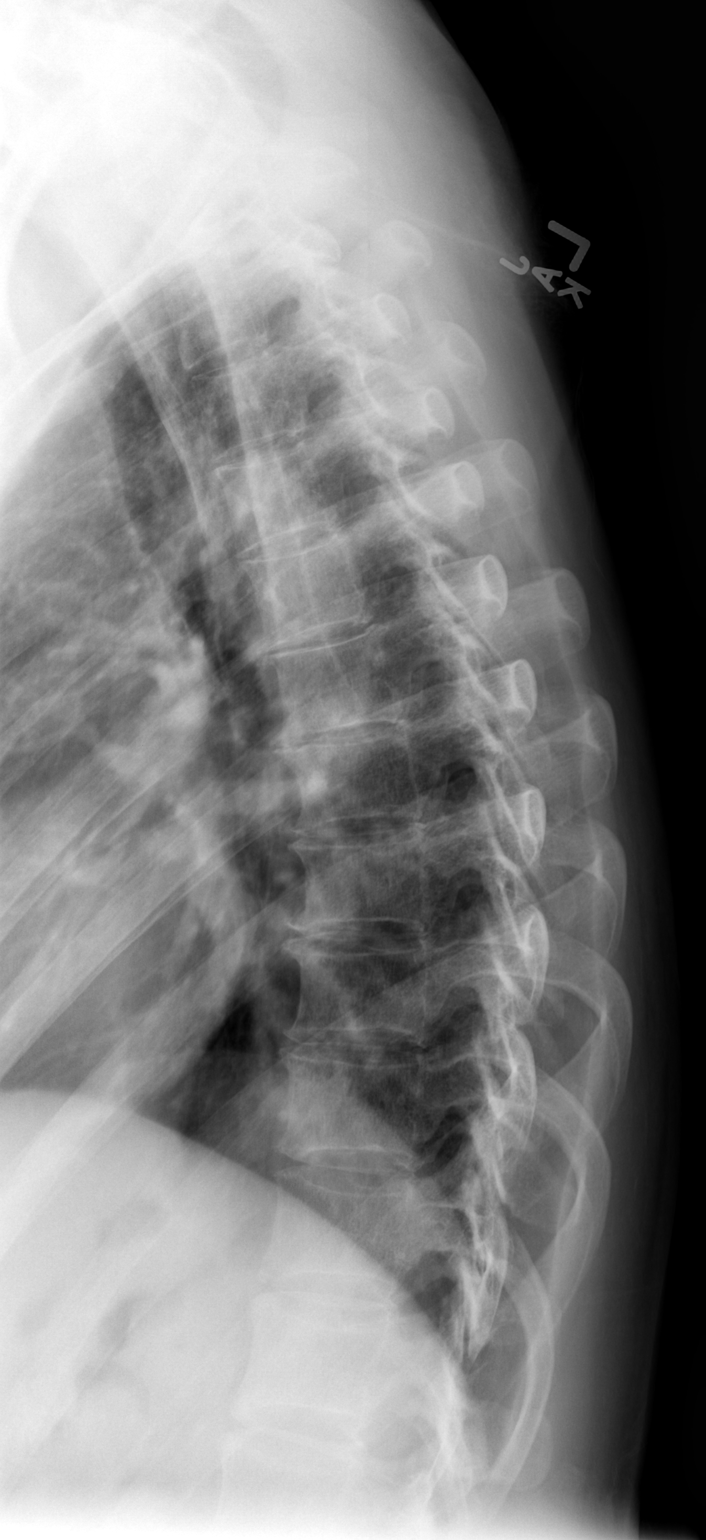

[t swimmers]
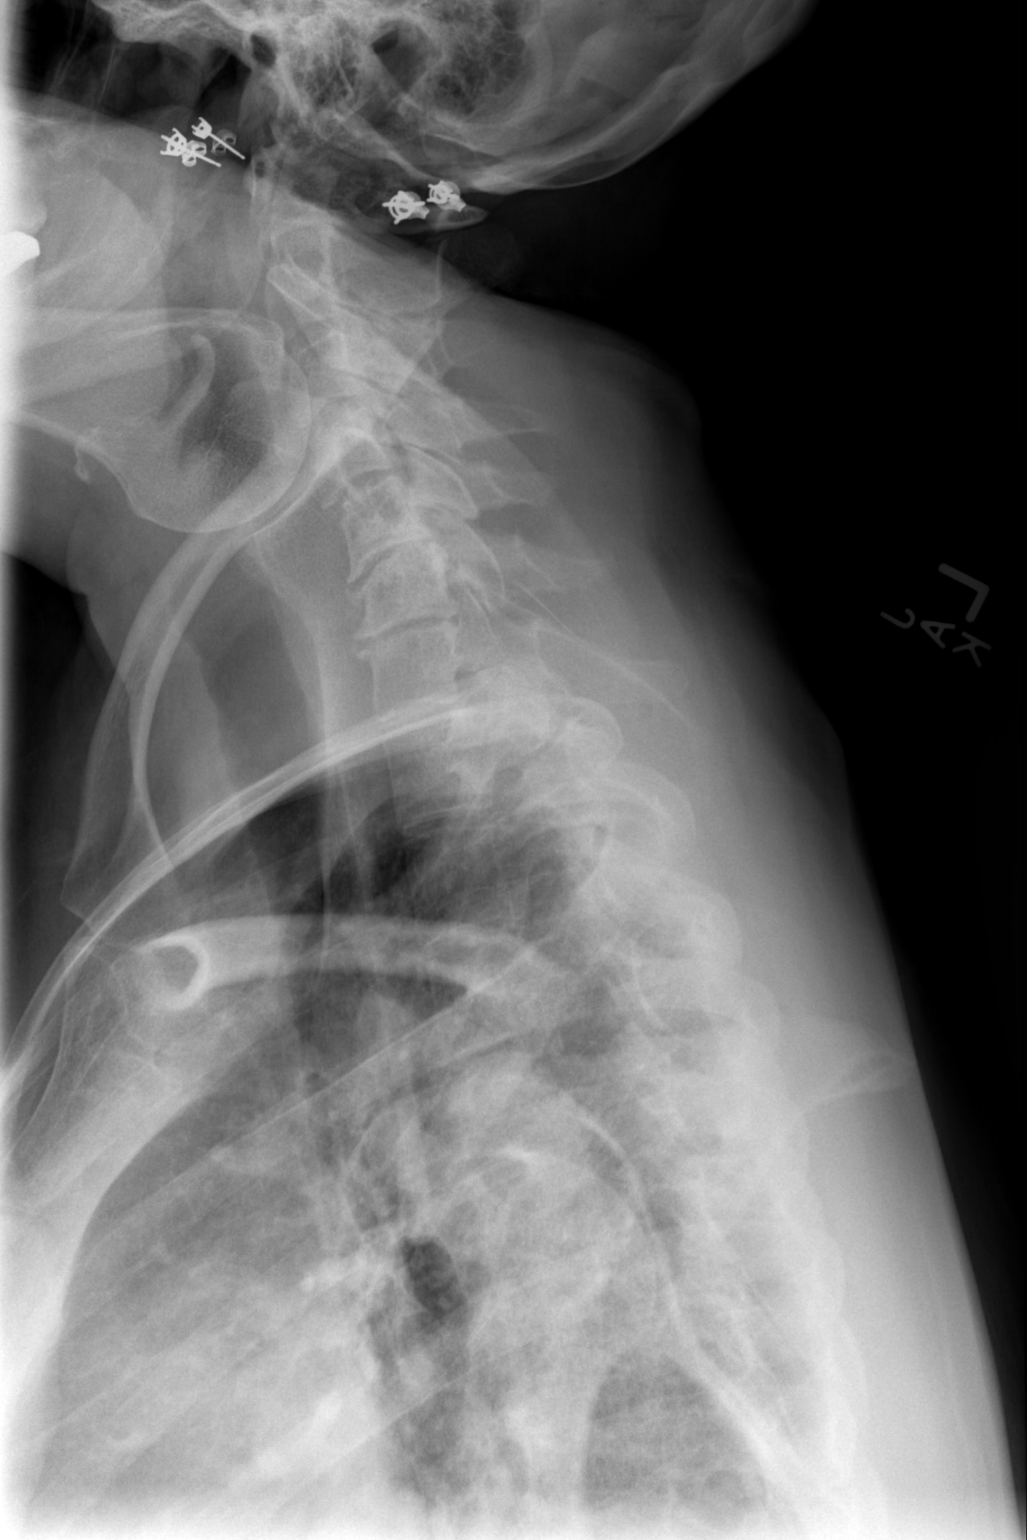

[3 of 3 positions shown; findings below may reference images not displayed]

FINDINGS: There is no evidence of thoracic spine fracture. Alignment is
normal. No other significant bone abnormalities are identified.
IMPRESSION: Negative.
